# Patient Record
Sex: Male | Born: 1985 | Race: Black or African American | Hispanic: No | Marital: Single | Smoking: Never smoker
Health system: Southern US, Community
[De-identification: ages and names within clinical notes are randomized; demographics above are authoritative.]

## PROBLEM LIST (undated history)

## (undated) DIAGNOSIS — K922 Gastrointestinal hemorrhage, unspecified: Secondary | ICD-10-CM

## (undated) DIAGNOSIS — F1910 Other psychoactive substance abuse, uncomplicated: Secondary | ICD-10-CM

## (undated) DIAGNOSIS — K56609 Unspecified intestinal obstruction, unspecified as to partial versus complete obstruction: Secondary | ICD-10-CM

## (undated) DIAGNOSIS — F32A Depression, unspecified: Secondary | ICD-10-CM

## (undated) DIAGNOSIS — I639 Cerebral infarction, unspecified: Secondary | ICD-10-CM

## (undated) DIAGNOSIS — F1020 Alcohol dependence, uncomplicated: Secondary | ICD-10-CM

## (undated) HISTORY — DX: Depression, unspecified: F32.A

## (undated) HISTORY — DX: Alcohol dependence, uncomplicated: F10.20

## (undated) HISTORY — DX: Gastrointestinal hemorrhage, unspecified: K92.2

## (undated) HISTORY — DX: Cerebral infarction, unspecified: I63.9

## (undated) HISTORY — DX: Other psychoactive substance abuse, uncomplicated: F19.10

## (undated) HISTORY — DX: Unspecified intestinal obstruction, unspecified as to partial versus complete obstruction: K56.609

---

## 2009-02-25 ENCOUNTER — Emergency Department (HOSPITAL_COMMUNITY): Admission: EM | Admit: 2009-02-25 | Discharge: 2009-02-25 | Payer: Self-pay | Admitting: Emergency Medicine

## 2010-02-25 ENCOUNTER — Ambulatory Visit: Payer: Self-pay | Admitting: Internal Medicine

## 2010-02-25 DIAGNOSIS — L03818 Cellulitis of other sites: Secondary | ICD-10-CM

## 2010-02-25 DIAGNOSIS — L02818 Cutaneous abscess of other sites: Secondary | ICD-10-CM

## 2010-02-25 DIAGNOSIS — R1900 Intra-abdominal and pelvic swelling, mass and lump, unspecified site: Secondary | ICD-10-CM

## 2010-03-24 ENCOUNTER — Encounter: Admission: RE | Admit: 2010-03-24 | Discharge: 2010-03-24 | Payer: Self-pay | Admitting: Internal Medicine

## 2010-03-29 ENCOUNTER — Telehealth: Payer: Self-pay | Admitting: Internal Medicine

## 2010-04-02 ENCOUNTER — Telehealth: Payer: Self-pay | Admitting: Internal Medicine

## 2010-04-02 ENCOUNTER — Ambulatory Visit: Payer: Self-pay | Admitting: Internal Medicine

## 2010-04-02 DIAGNOSIS — N508 Other specified disorders of male genital organs: Secondary | ICD-10-CM

## 2010-04-05 LAB — CONVERTED CEMR LAB
Bilirubin Urine: NEGATIVE
Leukocytes, UA: NEGATIVE
Urine Glucose: NEGATIVE mg/dL
Urobilinogen, UA: 1 (ref 0.0–1.0)
pH: 6 (ref 5.0–8.0)

## 2010-05-05 ENCOUNTER — Ambulatory Visit: Payer: Self-pay | Admitting: Internal Medicine

## 2010-12-14 NOTE — Assessment & Plan Note (Signed)
Summary: NEW/ [employer]/ [place]   Vital Signs:  Patient profile:   25 year old male Height:      69 inches Weight:      155.56 pounds BMI:     23.06 O2 Sat:      98 % on Room air Temp:     97.5 degrees F oral Pulse rate:   70 / minute Pulse rhythm:   regular Resp:     16 per minute BP sitting:   112 / 72  (left arm) Cuff size:   large  Vitals Entered By: Rock Nephew CMA (February 25, 2010 2:55 PM)  O2 Flow:  Room air CC: Swollen R testicle w/ fever and sluggish Is Patient Diabetic? No   Primary Care Provider:  Etta Grandchild MD  CC:  Swollen R testicle w/ fever and sluggish.  History of Present Illness: New to me he complains of a bump in his right groin that has been there since 2007 but has slowly grown over the last few days.  Preventive Screening-Counseling & Management  Alcohol-Tobacco     Alcohol drinks/day: <1     Alcohol type: all     >5/day in last 3 mos: no     Alcohol Counseling: not indicated; use of alcohol is not excessive or problematic     Feels need to cut down: no     Feels annoyed by complaints: no     Feels guilty re: drinking: no     Needs 'eye opener' in am: no     Smoking Status: never  Caffeine-Diet-Exercise     Does Patient Exercise: yes  Hep-HIV-STD-Contraception     Hepatitis Risk: no risk noted     HIV Risk: no risk noted     STD Risk: no risk noted     TSE monthly: yes     Testicular SE Education/Counseling to perform regular STE  Safety-Violence-Falls     Seat Belt Use: yes     Helmet Use: yes     Firearms in the Home: firearms in the home     Smoke Detectors: yes     Violence in the Home: no risk noted     Sexual Abuse: no      Sexual History:  currently monogamous.        Drug Use:  never and no.        Blood Transfusions:  no.    Medications Prior to Update: 1)  None  Current Medications (verified): 1)  Multivitamin 2)  Sulfamethoxazole-Tmp Ds 800-160 Mg Tab (Trimethoprim-Sulfamethoxazole) .... Take 1 Tablet  By Mouth Morning and Night For 14 Days  Allergies (verified): No Known Drug Allergies  Past History:  Past Medical History: Unremarkable  Past Surgical History: Denies surgical history  Family History: Family History of Arthritis Family History Diabetes 1st degree relative Family History Hypertension Family History of Prostate CA 1st degree relative <50  Social History: Single Never Smoked Alcohol use-yes Drug use-no Regular exercise-yes Smoking Status:  never Drug Use:  never, no Does Patient Exercise:  yes Hepatitis Risk:  no risk noted HIV Risk:  no risk noted STD Risk:  no risk noted Seat Belt Use:  yes Sexual History:  currently monogamous Blood Transfusions:  no  Review of Systems  The patient denies anorexia, fever, weight loss, chest pain, prolonged cough, abdominal pain, hematuria, genital sores, suspicious skin lesions, enlarged lymph nodes, angioedema, and testicular masses.   General:  Denies chills, fatigue, fever, loss of appetite, malaise,  and sweats. GU:  Denies discharge, dysuria, genital sores, hematuria, incontinence, nocturia, urinary frequency, and urinary hesitancy.  Physical Exam  General:  alert, well-developed, well-nourished, well-hydrated, appropriate dress, normal appearance, healthy-appearing, cooperative to examination, and good hygiene.   Head:  normocephalic, atraumatic, no abnormalities observed, and no abnormalities palpated.   Eyes:  vision grossly intact, pupils equal, pupils round, and pupils reactive to light.   Mouth:  Oral mucosa and oropharynx without lesions or exudates.  Teeth in good repair. Neck:  supple, full ROM, no masses, no thyromegaly, no JVD, normal carotid upstroke, and no carotid bruits.   Lungs:  normal respiratory effort, no intercostal retractions, no accessory muscle use, normal breath sounds, no dullness, no fremitus, no crackles, and no wheezes.   Heart:  normal rate, regular rhythm, no murmur, no gallop, no  rub, and no JVD.   Abdomen:  soft, non-tender, normal bowel sounds, no distention, no masses, no guarding, no rigidity, no rebound tenderness, no abdominal hernia, no inguinal hernia, no hepatomegaly, and no splenomegaly.   Genitalia:  circumcised, no hydrocele, no varicocele, no scrotal masses, no testicular masses or atrophy, no cutaneous lesions, and no urethral discharge.  in the right groin adjacent to the right scrotum in the intertriginous region there is an elongated subcutaneously mass that is soft and possibly fluctuant. it measures 2 cm x 0.5 cm.  the overlying skin is normal with no erythema or lesions. Skin:  turgor normal, color normal, no rashes, no suspicious lesions, no ecchymoses, no petechiae, no purpura, no ulcerations, no edema, and tattoo(s).   Cervical Nodes:  no anterior cervical adenopathy and no posterior cervical adenopathy.   Axillary Nodes:  no R axillary adenopathy and no L axillary adenopathy.   Inguinal Nodes:  no R inguinal adenopathy and no L inguinal adenopathy.   Psych:  Cognition and judgment appear intact. Alert and cooperative with normal attention span and concentration. No apparent delusions, illusions, hallucinations   Impression & Recommendations:  Problem # 1:  PELVIC MASS (ICD-789.30) Assessment New I can't tell if this vascular, cystic, or infectious. will start antibiotics and get u/s to clarify. Orders: Radiology Referral (Radiology)  Problem # 2:  CELLULITIS/ABSCESS, SITE NEC (ZOX-096.8) Assessment: New  His updated medication list for this problem includes:    Sulfamethoxazole-tmp Ds 800-160 Mg Tab (Trimethoprim-sulfamethoxazole) .Marland Kitchen... Take 1 tablet by mouth morning and night for 14 days  Elevate affected area. Warm moist compresses for 20 minutes every 2 hours while awake. Take antibiotics as directed and take acetaminophen as needed. To be seen in 48-72 hours if no improvement, sooner if worse.  Complete Medication List: 1)  Multivitamin    2)  Sulfamethoxazole-tmp Ds 800-160 Mg Tab (Trimethoprim-sulfamethoxazole) .... Take 1 tablet by mouth morning and night for 14 days  Patient Instructions: 1)  Please schedule a follow-up appointment in 2 weeks. 2)  Take your antibiotic as prescribed until ALL of it is gone, but stop if you develop a rash or swelling and contact our office as soon as possible. Prescriptions: SULFAMETHOXAZOLE-TMP DS 800-160 MG TAB (TRIMETHOPRIM-SULFAMETHOXAZOLE) Take 1 tablet by mouth morning and night for 14 days  #28 x 1   Entered and Authorized by:   Etta Grandchild MD   Signed by:   Etta Grandchild MD on 02/25/2010   Method used:   Print then Give to Patient   RxID:   [id number]

## 2010-12-14 NOTE — Progress Notes (Signed)
Summary: RESULTS/Cause?  Phone Note Call from Patient Call back at [phone]   Summary of Call: Pt had u/s and wanted results. He has completed antibiotic and says area is back to normal. Pt wants to know if MD thinks that this was an infection, what was the cause?  Initial call taken by: Lamar Sprinkles, CMA,  Mar 29, 2010 10:00 AM  Follow-up for Phone Call        u/s was normal, must have been an infection that resolved Follow-up by: Etta Grandchild MD,  Mar 29, 2010 12:26 PM  Additional Follow-up for Phone Call Additional follow up Details #1::        Pt is concerned about what the cause of the infection was?  Additional Follow-up by: Lamar Sprinkles, CMA,  Mar 29, 2010 12:41 PM    Additional Follow-up for Phone Call Additional follow up Details #2::    staph under the skin Follow-up by: Etta Grandchild MD,  Mar 29, 2010 12:48 PM  Additional Follow-up for Phone Call Additional follow up Details #3:: Details for Additional Follow-up Action Taken: Pt informed  Additional Follow-up by: Lamar Sprinkles, CMA,  Mar 29, 2010 4:42 PM

## 2010-12-14 NOTE — Progress Notes (Signed)
  Phone Note Other Incoming   Caller: Randy Joyce  Summary of Call: Randy Joyce was seen by Dr Yetta Barre back in April for a lump or bump in his groin. Dr Rudean Curt him an antibiotic to take that Randy Joyce states helped with this. Randy Joyce states he has another one the has came up near his penis. He wants the same medication. Dr Yetta Barre is not here What do you suggest? Initial call taken by: Ami Bullins CMA,  Apr 02, 2010 9:07 AM  Follow-up for Phone Call        cannot say as this would require exam; I am not in the office today;  please ask Randy Joyce to see Dr Felicity Coyer or other today - may need antibx (antibx o/w not normally prescribed by phone as per office policy) Follow-up by: Corwin Levins MD,  Apr 02, 2010 9:10 AM  Additional Follow-up for Phone Call Additional follow up Details #1::        Randy Joyce advised and transferred to sch appt with Dr. Macario Golds this afternoon Additional Follow-up by: Margaret Pyle, CMA,  Apr 02, 2010 9:20 AM

## 2010-12-14 NOTE — Assessment & Plan Note (Signed)
Summary: DR Nemiah Commander PT/NO CLINIC--PER DAH SCHED--LUMP GROIN AREA  STC   Vital Signs:  Patient profile:   25 year old male Height:      69 inches Weight:      153.25 pounds BMI:     22.71 O2 Sat:      97 % on Room air Temp:     98.2 degrees F oral Pulse rate:   74 / minute BP sitting:   110 / 74  (left arm) Cuff size:   regular  Vitals Entered By: Lucious Groves (Apr 02, 2010 3:33 PM)  O2 Flow:  Room air CC: C/O lump has returned in pelvic region./kb Is Patient Diabetic? No Pain Assessment Patient in pain? no        Primary Care Provider:  Etta Grandchild MD  CC:  C/O lump has returned in pelvic region./kb.  History of Present Illness: C/o an elongated tender mass in R groin tender, first noticed this mass in mid- April. He had Bactrim - got better and mass moved  more inside; now  is worse again w/dull pain. 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   Current Medications (verified): 1)  Multivitamin  Allergies (verified): No Known Drug Allergies  Past History:  Past Medical History: Last updated: 02/25/2010 Unremarkable  Past Surgical History: Last updated: 02/25/2010 Denies surgical history  Family History: Last updated: 02/25/2010 Family History of Arthritis Family History Diabetes 1st degree relative Family History Hypertension Family History of Prostate CA 1st degree relative <50  Social History: Last updated: 02/25/2010 Single Never Smoked Alcohol use-yes Drug use-no Regular exercise-yes  Review of Systems  The patient denies fever, abdominal pain, severe indigestion/heartburn, hematuria, and genital sores.    Physical Exam  Abdomen:  soft, non-tender, normal bowel sounds, no distention, no masses, no guarding, no rigidity, no rebound tenderness, no abdominal hernia, no inguinal hernia, no hepatomegaly, and no splenomegaly.   Genitalia:  circumcised, no hydrocele, no varicocele, no scrotal masses, no testicular masses or atrophy, no  cutaneous lesions, and no urethral discharge.  In  the right prox. scrotum  there is an elongated subcutaneously mass that is firmt. it measures 2 cm x 0.5 cm.  the overlying skin is normal with no erythema or lesions.   Impression & Recommendations:  Problem # 1:  SCROTAL MASS (ICD-608.89) ? thrombosed scrotal vein on the R vs other Assessment New  Support underwear ASA Cipro if worse The office visit took longer than 25 min with patient councelling for more than 50% of the 25 min  Orders: TLB-Udip ONLY ([id number]) Urology Referral (Urology)  Complete Medication List: 1)  Vitamin D 1000 Unit Tabs (Cholecalciferol) .Marland Kitchen.. 1 by mouth qd 2)  Aspirin 325 Mg Tabs (Aspirin) .... Take 1 po bid pc x 1 wk, then 1 by mouth once daily pc 3)  Ciprofloxacin Hcl 500 Mg Tabs (Ciprofloxacin hcl) .Marland Kitchen.. 1 by mouth bid  Patient Instructions: 1)  Ellastic support anderwear ([employer]) 2)  Call if you are not better in a reasonable amount of time or if worse.  3)  Please schedule a follow-up appointment in 1 month Dr Yetta Barre. Prescriptions: CIPROFLOXACIN HCL 500 MG TABS (CIPROFLOXACIN HCL) 1 by mouth bid  #20 x 1   Entered and Authorized by:   Tresa Garter MD   Signed by:   Tresa Garter MD on 04/02/2010   Method used:   Print then Give to Patient   RxID:  [id number] ASPIRIN 325 MG TABS (ASPIRIN) Take 1 po bid pc x 1 wk, then 1 by mouth once daily pc  #100 x 1   Entered and Authorized by:   Tresa Garter MD   Signed by:   Tresa Garter MD on 04/02/2010   Method used:   Print then Give to Patient   RxID:   [id number] VITAMIN D 1000 UNIT TABS (CHOLECALCIFEROL) 1 by mouth qd  #100 x 3   Entered and Authorized by:   Tresa Garter MD   Signed by:   Tresa Garter MD on 04/02/2010   Method used:   Print then Give to Patient   RxID:   [id number]   Appended Document: DR Nemiah Commander PT/NO CLINIC--PER DAH SCHED--LUMP GROIN AREA  STC Called pt spoke with  grandmother, son already left to go back home will contact his mother and have her to return our call back...04/05/10@9 :15am/LMB

## 2011-07-25 IMAGING — US US PELVIS LIMITED
1 series · 12 of 12 positions shown · non-contrast
Comparison: None.

CLINICAL DATA: "Mass" in the region of the right groin pain.

US PELVIS TRANSVAGINAL

[Series 1: us pelvis limited · 0.06mm/px · 12 of 12 slices shown]
[im 1/12]
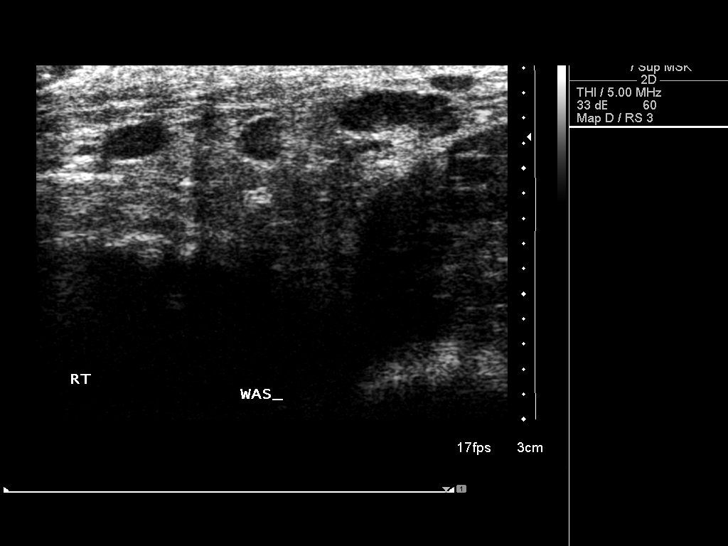
[im 2/12]
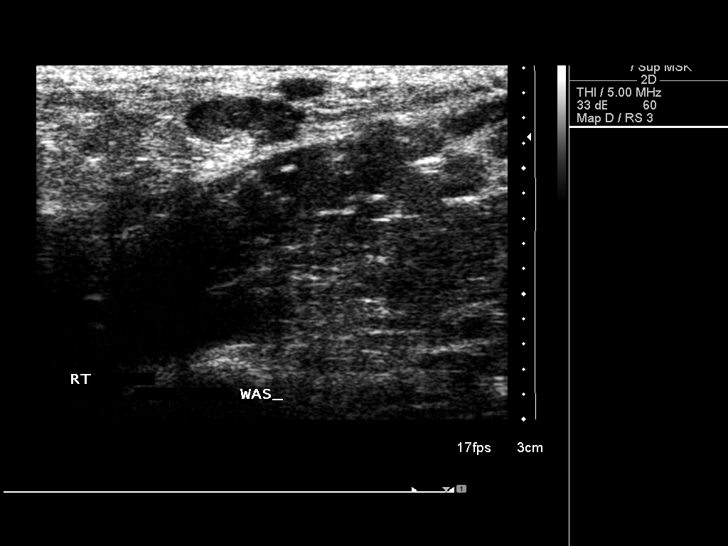
[im 3/12]
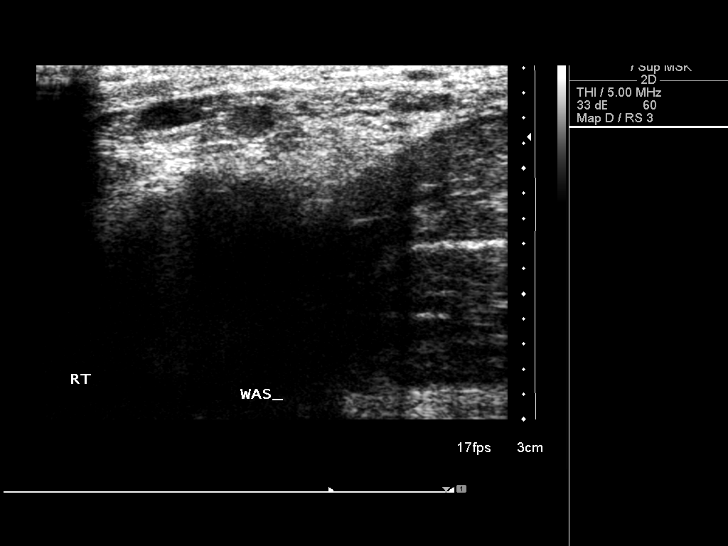
[im 4/12]
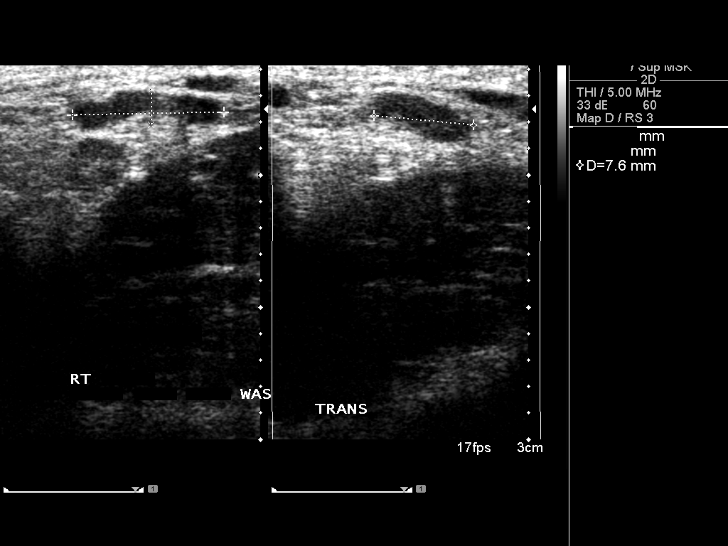
[im 5/12]
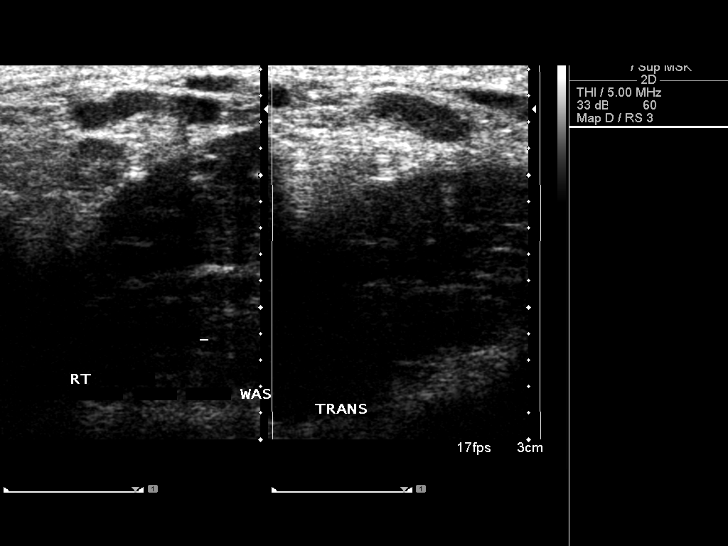
[im 6/12]
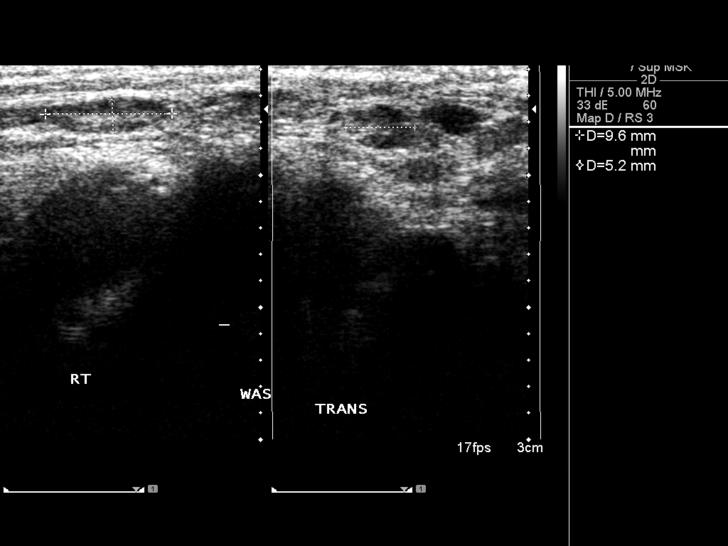
[im 7/12]
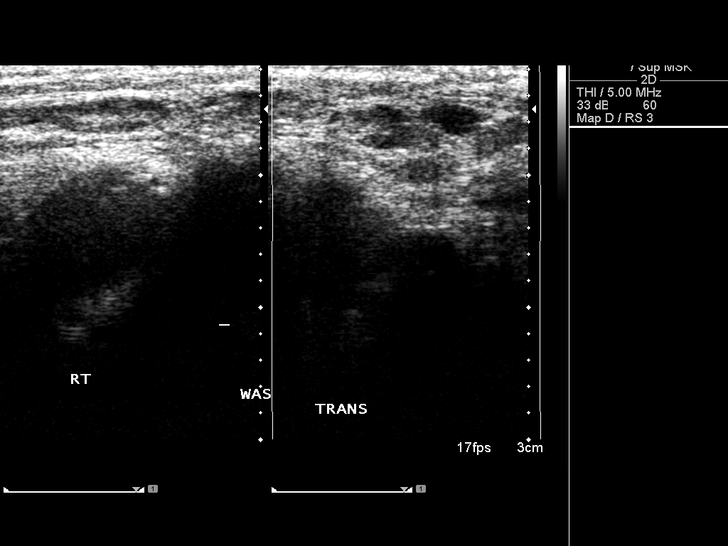
[im 8/12]
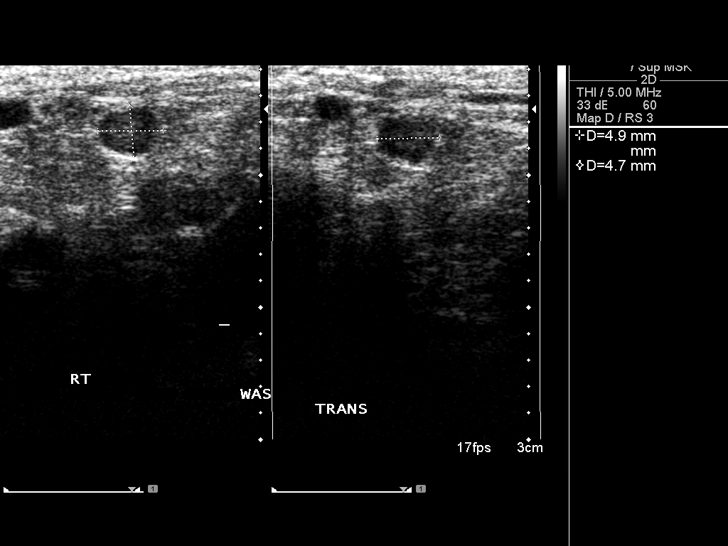
[im 9/12]
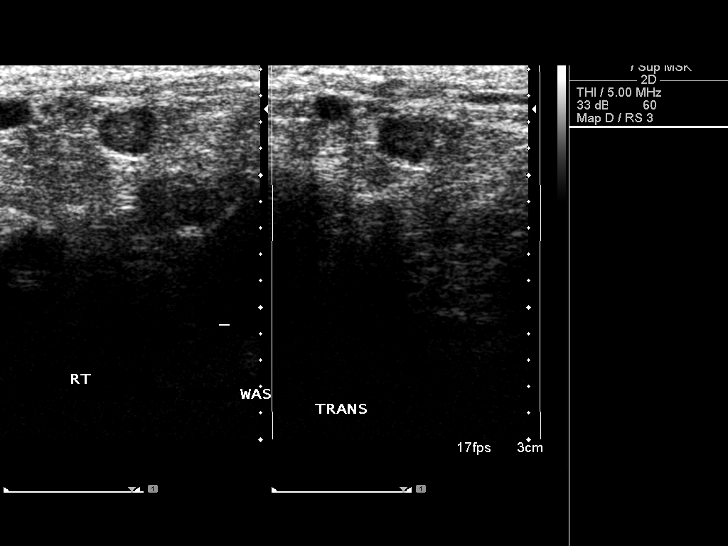
[im 10/12]
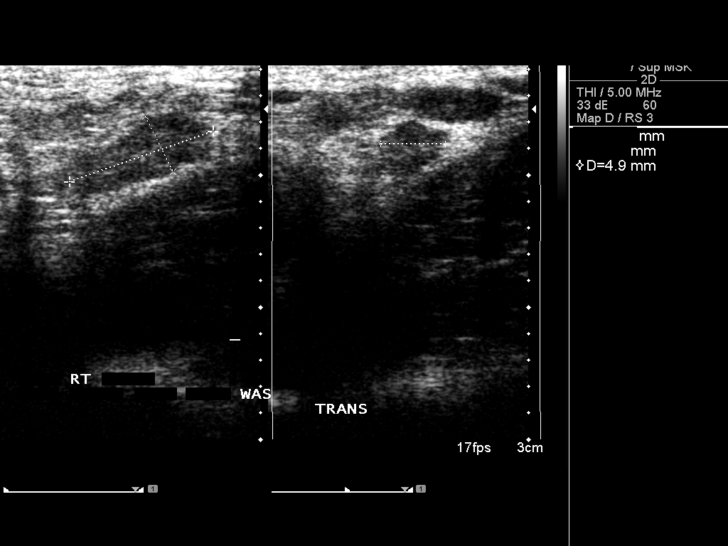
[im 11/12]
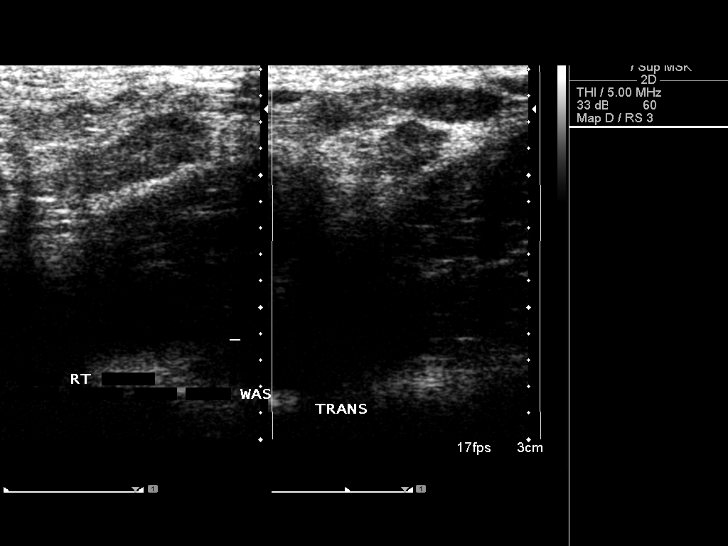
[im 12/12]
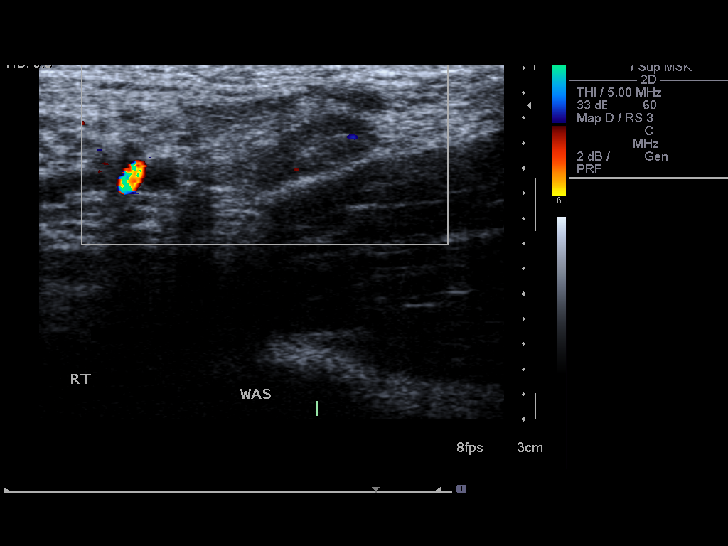

[12 of 12 positions shown; findings below may reference images not displayed]

FINDINGS: Focused ultrasound exam was performed in an area
identified by the patient as the region of concern.  This is in the
right groin area.  Several lymph nodes are identified in this
region which are in the upper normal size range.  There is no
evidence for focal superficial fluid collection to suggest a
subcutaneous abscess.

Ultrasound evaluation of the intraperitoneal structures and pelvic
floor was not performed as part of this study.
IMPRESSION: No evidence for abscess or mass in the area of the patient's
concern, identified as the superficial right groin region.

## 2011-12-10 ENCOUNTER — Encounter (HOSPITAL_COMMUNITY): Payer: Self-pay | Admitting: *Deleted

## 2011-12-10 ENCOUNTER — Emergency Department (HOSPITAL_COMMUNITY)
Admission: EM | Admit: 2011-12-10 | Discharge: 2011-12-10 | Disposition: A | Discharge status: Home / Self Care | Payer: BC Managed Care – PPO | Attending: Emergency Medicine | Admitting: Emergency Medicine

## 2011-12-10 DIAGNOSIS — M791 Myalgia, unspecified site: Secondary | ICD-10-CM

## 2011-12-10 DIAGNOSIS — IMO0001 Reserved for inherently not codable concepts without codable children: Secondary | ICD-10-CM | POA: Insufficient documentation

## 2011-12-10 NOTE — ED Provider Notes (Signed)
History     CSN: [id number]  Arrival date & time 12/10/11  1457   First MD Initiated Contact with Patient 12/10/11 1546      Chief Complaint  Patient presents with  . Generalized Body Aches    (Consider location/radiation/quality/duration/timing/severity/associated sxs/prior treatment) The history is provided by the patient.  pt notes body aches for past few days. Generalized. Mild, dull, constant. No focal pain. States had 'flu' last week w congestion, scratchy throat, occ cough, subj fever. States those symptoms have resolved, but mild body aches persist. Is eating and drinking. No cp or sob. No abd pain. No nvd. No gu c/o. No headache. Pt requests note for work.   History reviewed. No pertinent past medical history.  History reviewed. No pertinent past surgical history.  History reviewed. No pertinent family history.  History  Substance Use Topics  . Smoking status: Never Smoker   . Smokeless tobacco: Not on file  . Alcohol Use: No      Review of Systems  Constitutional: Negative for fever and chills.  HENT: Negative for sore throat, neck pain and neck stiffness.   Eyes: Negative for redness.  Respiratory: Negative for shortness of breath.   Cardiovascular: Negative for chest pain.  Gastrointestinal: Negative for abdominal pain.  Genitourinary: Negative for flank pain.  Musculoskeletal: Negative for back pain.  Skin: Negative for rash.  Neurological: Negative for headaches.  Hematological: Does not bruise/bleed easily.  Psychiatric/Behavioral: Negative for confusion.    Allergies  Review of patient's allergies indicates no known allergies.  Home Medications  No current outpatient prescriptions on file.  BP 118/68  Pulse 90  Temp(Src) 98.1 F (36.7 C) (Oral)  Resp 16  SpO2 97%  Physical Exam  Nursing note and vitals reviewed. Constitutional: He is oriented to person, place, and time. He appears well-developed and well-nourished. No distress.  HENT:    Head: Atraumatic.  Mouth/Throat: Oropharynx is clear and moist.  Eyes: Pupils are equal, round, and reactive to light.  Neck: Normal range of motion. Neck supple. No tracheal deviation present. No thyromegaly present.       No stiffness/rigidity  Cardiovascular: Normal rate, regular rhythm, normal heart sounds and intact distal pulses.  Exam reveals no gallop and no friction rub.   No murmur heard. Pulmonary/Chest: Effort normal and breath sounds normal. No accessory muscle usage. No respiratory distress.  Abdominal: Soft. He exhibits no distension. There is no tenderness.  Musculoskeletal: Normal range of motion. He exhibits no edema and no tenderness.  Lymphadenopathy:    He has no cervical adenopathy.  Neurological: He is alert and oriented to person, place, and time.       Motor intact. Steady gait.   Skin: Skin is warm and dry.  Psychiatric: He has a normal mood and affect.    ED Course  Procedures (including critical care time)     MDM  Pt requests work note.         Suzi Roots, MD 12/10/11 1600

## 2011-12-10 NOTE — ED Notes (Signed)
Patient here with c/o generalized body aches.  States that he had the flu last week with vomiting but the vomiting is gone and now he is just complaining for generalized body aches.  Denies temperature or constant cough

## 2011-12-10 NOTE — ED Notes (Signed)
MD at bedside. 

## 2013-11-14 DIAGNOSIS — I639 Cerebral infarction, unspecified: Secondary | ICD-10-CM

## 2013-11-14 HISTORY — DX: Cerebral infarction, unspecified: I63.9

## 2017-11-14 HISTORY — PX: MOUTH SURGERY: SHX715

## 2018-05-03 ENCOUNTER — Encounter (HOSPITAL_COMMUNITY): Payer: Self-pay | Admitting: Family Medicine

## 2018-05-03 ENCOUNTER — Ambulatory Visit (HOSPITAL_COMMUNITY)
Admission: EM | Admit: 2018-05-03 | Discharge: 2018-05-03 | Disposition: A | Discharge status: Home / Self Care | Payer: Self-pay | Attending: Nurse Practitioner | Admitting: Nurse Practitioner

## 2018-05-03 DIAGNOSIS — L0390 Cellulitis, unspecified: Secondary | ICD-10-CM

## 2018-05-03 DIAGNOSIS — S70362A Insect bite (nonvenomous), left thigh, initial encounter: Secondary | ICD-10-CM

## 2018-05-03 DIAGNOSIS — W57XXXA Bitten or stung by nonvenomous insect and other nonvenomous arthropods, initial encounter: Secondary | ICD-10-CM

## 2018-05-03 MED ORDER — SULFAMETHOXAZOLE-TRIMETHOPRIM 800-160 MG PO TABS: 1.0000 | ORAL_TABLET | Freq: Two times a day (BID) | ORAL | 0 refills | Status: AC

## 2018-05-03 NOTE — ED Triage Notes (Signed)
Pt here for 3 possible spider bites to left leg. One has been there a week and the other 2 formed a few days ago. sts some drainage and fever. Not feeling well.

## 2018-05-03 NOTE — ED Provider Notes (Addendum)
[place]    CSN: [id number] Arrival date & time: 05/03/18  3419     History   Chief Complaint Chief Complaint  Patient presents with  . Insect Bite    HPI Randy Joyce is a 32 y.o. male.   Subjective:   Randy Joyce is a 32 y.o. male who presents for evaluation of a possible skin infection located on the left thigh. Onset of symptoms was 1 week ago after being bitten by several spiders. He reports a gradual worsening course since that time. Symptoms include moderate pain. Patient denies any chills, fever, nausea or vomiting. No treatment to date  The following portions of the patient's history were reviewed and updated as appropriate: allergies, current medications, past family history, past medical history, past social history, past surgical history and problem list.        History reviewed. No pertinent past medical history.  Patient Active Problem List   Diagnosis Date Noted  . SCROTAL MASS 04/02/2010  . CELLULITIS/ABSCESS, SITE NEC 02/25/2010  . PELVIC MASS 02/25/2010    History reviewed. No pertinent surgical history.     Home Medications    Prior to Admission medications   Medication Sig Start Date End Date Taking? Authorizing Provider  sulfamethoxazole-trimethoprim (BACTRIM DS,SEPTRA DS) 800-160 MG tablet Take 1 tablet by mouth 2 (two) times daily for 10 days. 05/03/18 05/13/18  Enrique Sack, FNP    Family History History reviewed. No pertinent family history.  Social History Social History   Tobacco Use  . Smoking status: Never Smoker  Substance Use Topics  . Alcohol use: No  . Drug use: Not on file     Allergies   Patient has no known allergies.   Review of Systems Review of Systems  Constitutional: Negative for chills and fever.  Gastrointestinal: Negative for nausea and vomiting.  Skin: Positive for rash and wound.  All other systems reviewed and are negative.    Physical Exam Triage Vital Signs ED Triage Vitals    Enc Vitals Group     BP 05/03/18 1011 132/86     Pulse Rate 05/03/18 1011 76     Resp 05/03/18 1011 18     Temp 05/03/18 1011 98.1 F (36.7 C)     Temp src --      SpO2 05/03/18 1011 100 %     Weight --      Height --      Head Circumference --      Peak Flow --      Pain Score 05/03/18 1009 6     Pain Loc --      Pain Edu? --      Excl. in [place]? --    No data found.  Updated Vital Signs BP 132/86   Pulse 76   Temp 98.1 F (36.7 C)   Resp 18   SpO2 100%   Visual Acuity Right Eye Distance:   Left Eye Distance:   Bilateral Distance:    Right Eye Near:   Left Eye Near:    Bilateral Near:     Physical Exam  Constitutional: He is oriented to person, place, and time. He appears well-developed and well-nourished.  Neck: Normal range of motion. Neck supple.  Cardiovascular: Normal rate and regular rhythm.  Pulmonary/Chest: Effort normal and breath sounds normal.  Musculoskeletal: Normal range of motion.  Neurological: He is alert and oriented to person, place, and time.  Skin: Skin is warm and dry.  1.Pinpoint lesion noted to left upper thigh. No open area. No surrounding erythema. No drainage.  2. Pinpoint lesion noted to inner left upper thigh. No open area. Approximately 4x5 cm of surrounding erythema. No drainage.  3. Pinpoint lesion noted to left anterior lower leg. No open area. Approximately 5x3 cm area of surrounding erythema. No drainage.  Psychiatric: He has a normal mood and affect. His behavior is normal.     UC Treatments / Results  Labs (all labs ordered are listed, but only abnormal results are displayed) Labs Reviewed - No data to display  EKG None  Radiology No results found.  Procedures Procedures (including critical care time)  Medications Ordered in UC Medications - No data to display  Initial Impression / Assessment and Plan / UC Course  I have reviewed the triage vital signs and the nursing notes.  Pertinent labs & imaging  results that were available during my care of the patient were reviewed by me and considered in my medical decision making (see chart for details).    32 year old male with no significant medical history presenting with spider bites to the left thigh with possible skin infection. Patient AAOx3. Afebrile. VSS. Nontoxic appearing.    Plan:  1. Bactrim DS 1 tablet BID x 10 days  2. Wound care discussed  3. Warm compresses to areas several times daily 4.  Do not manipulate the wounds 5.  Tylenol or Motrin as needed for pain 6.  Discussed indications for immediate follow-up  Today's evaluation has revealed no signs of a dangerous process. Discussed diagnosis with patient. Patient aware of their diagnosis, possible red flag symptoms to watch out for and need for close follow up. Patient understands verbal and written discharge instructions. Patient comfortable with plan and disposition.  Patient has a clear mental status at this time, good insight into illness (after discussion and teaching) and has clear judgment to make decisions regarding their care.  Documentation was completed with the aid of voice recognition software. Transcription may contain typographical errors.   Final Clinical Impressions(s) / UC Diagnoses   Final diagnoses:  Insect bite of left thigh, initial encounter  Cellulitis, unspecified cellulitis site   Discharge Instructions   None    ED Prescriptions    Medication Sig Dispense Auth. Provider   sulfamethoxazole-trimethoprim (BACTRIM DS,SEPTRA DS) 800-160 MG tablet Take 1 tablet by mouth 2 (two) times daily for 10 days. 20 tablet Enrique Sack, FNP     Controlled Substance Prescriptions  Controlled Substance Registry consulted? Not Applicable   Enrique Sack, [place] 05/03/18 [place], [place], [place] 05/03/18 1036

## 2019-05-21 ENCOUNTER — Inpatient Hospital Stay
Admit: 2019-05-21 | Discharge: 2019-05-21 | Disposition: A | Discharge status: Home / Self Care | Payer: Self-pay | Attending: Emergency Medicine

## 2019-05-21 DIAGNOSIS — L03011 Cellulitis of right finger: Secondary | ICD-10-CM

## 2019-05-21 MED ORDER — CEPHALEXIN 500 MG CAP
500 mg | ORAL_CAPSULE | Freq: Four times a day (QID) | ORAL | 0 refills | Status: DC
Start: 2019-05-21 — End: 2019-05-23

## 2019-05-21 MED ORDER — IBUPROFEN 800 MG TAB
800 mg | ORAL_TABLET | Freq: Four times a day (QID) | ORAL | 0 refills | Status: AC | PRN
Start: 2019-05-21 — End: 2019-05-28

## 2019-05-21 NOTE — ED Triage Notes (Signed)
Ambulatory to triage wearing mask from home.  Pt states he was bit by spider at work last night.  Swelling to right hand noted.

## 2019-05-21 NOTE — ED Notes (Signed)
I have reviewed discharge instructions with the patient.  The patient verbalized understanding.    Patient left ED via Discharge Method: ambulatory to Home with self.    Opportunity for questions and clarification provided.       Patient given 2 scripts.         To continue your aftercare when you leave the hospital, you may receive an automated call from our care team to check in on how you are doing.  This is a free service and part of our promise to provide the best care and service to meet your aftercare needs.??? If you have questions, or wish to unsubscribe from this service please call [phone].  Thank you for Choosing our [facility].

## 2019-05-21 NOTE — ED Provider Notes (Addendum)
HPI:  58 male here with right finger pain.  Stated he was bitten by a spider" at work.  Last night.  Since then has had swelling.  Denies any other trauma.  Denies any fever.  Pain with movement at the right fifth digit.    ROS  Constitutional: No fever, no chills  Skin: + rash  Respiratory: No shortness of breath  Cardiovascular: No chest pain  Gastrointestinal:    GU:   MSK:  no joint pain  Neuro: No headache, no change in mental status, no numbness, no tingling, no weakness  Psych:   Endocrine:   All other review of systems positive per history of present illness and the above otherwise negative or noncontributory.    Visit Vitals  BP 128/83 (BP 1 Location: Right arm, BP Patient Position: Sitting)   Pulse 81   Temp 97.8 ??F (36.6 ??C)   Resp 18   Ht 5\' 10"  (1.778 m)   Wt 79.8 kg (176 lb)   SpO2 95%   BMI 25.25 kg/m??     No past medical history on file.  No past surgical history on file.  None         Adult Exam   General: alert, no acute distress  Head: normocephalic, atraumatic  ENT: moist mucous membranes  Neck:  full range of motion  Back: non-tender, full range of motion  Musculoskeletal: Right fifth digit at the MCP dorsum aspect with what appeared to be consistent with a wound.  Mild localized erythema and cellulitis.  He is able to open and close his hand with some complaint of discomfort.  There is no skin crepitus.  There is no swelling noted in the palmar surface.  Neurological: alert and oriented x 4, no gross focal deficits; normal speech  Psychiatric: cooperative; appropriate mood and affect    MDM:  Concern for cellulitis from wound.  Will place on Keflex for 7 days.  Motrin as needed for pain.  Return precaution given.  No further questions or concerns.  Do not suspect bacteremia, extensor tenosynovitis    Dragon voice recognition software was used to create this note. Although the note has been reviewed and corrected where necessary, additional  errors may have been overlooked and remain in the text.

## 2019-05-21 NOTE — ED Notes (Signed)
Ambulatory to triage wearing mask from home.  Pt states he was bit by spider at work last night.  Swelling to right hand noted.

## 2019-05-21 NOTE — ED Provider Notes (Signed)
HPI:  73 male here with right finger pain.  Stated he was bitten by a spider" at work.  Last night.  Since then has had swelling.  Denies any other trauma.  Denies any fever.  Pain with movement at the right fifth digit.    ROS  Constitutional: No fever, no chills  Skin: + rash  Respiratory: No shortness of breath  Cardiovascular: No chest pain  Gastrointestinal:    GU:   MSK:  no joint pain  Neuro: No headache, no change in mental status, no numbness, no tingling, no weakness  Psych:   Endocrine:   All other review of systems positive per history of present illness and the above otherwise negative or noncontributory.    Visit Vitals  BP 128/83 (BP 1 Location: Right arm, BP Patient Position: Sitting)   Pulse 81   Temp 97.8 ??F (36.6 ??C)   Resp 18   Ht 5\' 10"  (1.778 m)   Wt 79.8 kg (176 lb)   SpO2 95%   BMI 25.25 kg/m??     No past medical history on file.  No past surgical history on file.  None         Adult Exam   General: alert, no acute distress  Head: normocephalic, atraumatic  ENT: moist mucous membranes  Neck:  full range of motion  Back: non-tender, full range of motion  Musculoskeletal: Right fifth digit at the MCP dorsum aspect with what appeared to be consistent with a wound.  Mild localized erythema and cellulitis.  He is able to open and close his hand with some complaint of discomfort.  There is no skin crepitus.  There is no swelling noted in the palmar surface.  Neurological: alert and oriented x 4, no gross focal deficits; normal speech  Psychiatric: cooperative; appropriate mood and affect    MDM:  Concern for cellulitis from wound.  Will place on Keflex for 7 days.  Motrin as needed for pain.  Return precaution given.  No further questions or concerns.  Do not suspect bacteremia, extensor tenosynovitis    Dragon voice recognition software was used to create this note. Although the note has been reviewed and corrected where necessary, additional errors may have been overlooked and remain in the  text.

## 2019-05-21 NOTE — ED Notes (Signed)

## 2019-05-23 ENCOUNTER — Inpatient Hospital Stay
Admit: 2019-05-23 | Discharge: 2019-05-23 | Disposition: A | Discharge status: Home / Self Care | Payer: Self-pay | Attending: Emergency Medicine

## 2019-05-23 ENCOUNTER — Emergency Department: Admit: 2019-05-23 | Payer: Self-pay

## 2019-05-23 DIAGNOSIS — L02511 Cutaneous abscess of right hand: Secondary | ICD-10-CM

## 2019-05-23 MED ORDER — TRIMETHOPRIM-SULFAMETHOXAZOLE 160 MG-800 MG TAB
160-800 mg | ORAL_TABLET | Freq: Two times a day (BID) | ORAL | 0 refills | Status: AC
Start: 2019-05-23 — End: 2019-05-30

## 2019-05-23 MED ORDER — MORPHINE 4 MG/ML INTRAVENOUS SOLUTION
4 mg/mL | INTRAVENOUS | Status: AC
Start: 2019-05-23 — End: 2019-05-23
  Administered 2019-05-23: 20:00:00 via INTRAMUSCULAR

## 2019-05-23 MED ORDER — HYDROCODONE-ACETAMINOPHEN 5 MG-325 MG TAB
5-325 mg | ORAL_TABLET | Freq: Four times a day (QID) | ORAL | 0 refills | Status: AC | PRN
Start: 2019-05-23 — End: 2019-05-26

## 2019-05-23 MED FILL — MORPHINE 4 MG/ML INTRAVENOUS SOLUTION: 4 mg/mL | INTRAVENOUS | Qty: 1

## 2019-05-23 NOTE — ED Provider Notes (Signed)
HPI:  [id number] male here with persistent right knuckle swelling and pain.  Patient was actually seen by me earlier this week after he was "bitten by a spider" at work.  Swollen that was suspicious for cellulitis.  Has been taking antibiotic as prescribed with no improvement in symptoms.  Denies any new trauma.  Denies any fever.    ROS  Constitutional: No fever, no chills  Skin: no rash  Respiratory: No shortness of breath  Cardiovascular: No chest pain  Gastrointestinal: no abdominal pain  GU: No dysuria  MSK: No back pain, no muscle pain, + joint pain  Neuro: No headache, no change in mental status, no numbness, no tingling, no weakness  Psych:   Endocrine:   All other review of systems positive per history of present illness and the above otherwise negative or noncontributory.    Visit Vitals  BP 138/85   Pulse 83   Temp 98.5 ??F (36.9 ??C)   Resp 18   SpO2 97%     History reviewed. No pertinent past medical history.  History reviewed. No pertinent surgical history.  Prior to Admission Medications   Prescriptions Last Dose Informant Patient Reported? Taking?   cephALEXin (Keflex) 500 mg capsule   No No   Sig: Take 1 Cap by mouth four (4) times daily for 7 days. Indications: an infection of the skin and the tissue below the skin   ibuprofen (MOTRIN) 800 mg tablet   No No   Sig: Take 1 Tab by mouth every six (6) hours as needed for Pain for up to 7 days. Indications: pain      Facility-Administered Medications: None         Adult Exam   General: alert, no acute distress  Head: normocephalic, atraumatic  ENT: moist mucous membranes  Neck: supple, non-tender; full range of motion  Cardiovascular:   Respiratory:  normal respirations  Gastrointestinal:   Back: non-tender, full range of motion  Musculoskeletal: normal range of motion, normal strength.  Right hand dorsum over the fifth MCP with worsening swelling, redness.  There is now a fluctuant pocket that is suspicious for cellulitis with abscess.   Some mild resistant with closing of the fifth right digit.   Capillary refill intact with good distal pulses  Neurological: alert and oriented x 4, no gross focal deficits; normal speech  Psychiatric: cooperative; appropriate mood and affect    MDM:  We will obtain an x-ray.  Give a dose of IM morphine.  Will need to likely perform incision and drainage for suspicious abscess    5:03 PM  Xray without any foreign body.  Bedside incision and drainage performed.  Between 3 to 4 cc of purulent drainage noted from the right knuckle.  Will switch to Bactrim DS for 7 days.  Give Norco for pain.  Return precaution given.    Xr Hand Rt Min 3 V    Result Date: 05/23/2019  Right hand 3 views dated 05/23/2019 CLINICAL INFORMATION pain and swelling at fifth knuckle after spider bite There is an old healed fracture of the second metacarpal no acute fracture or dislocation. There is no radiopaque foreign body. Mild soft tissue swelling at the fifth finger.     IMPRESSION: No acute bony abnormality      I&D   Procedure Note - Incision and Drainage  Performed by: Charolette Childat Maiko Salais, MD  Immediately prior to the procedure, the patient was reevaluated and found suitable for the planned procedure and any planned medications.  The identified area was prepped with chlorhexidine. Anesthesia was achieved with 2 mLs of 1% Lidocaine. The Rt hand abscess was incised with a #11 blade and 3-4 mLs of purulent drainage was expressed. A dressing was applied.  The procedure took 10 minutes.  The patient tolerated the procedure well.      Dragon voice recognition software was used to create this note. Although the note has been reviewed and corrected where necessary, additional errors may have been overlooked and remain in the text.

## 2019-05-23 NOTE — ED Notes (Signed)
Patient advises that he does have family/ friend who will be coming to pick them up for transport.

## 2019-05-23 NOTE — ED Triage Notes (Signed)
Pt states he was seen for a spider bite on R hand 3 days ago and states the swelling and pain is getting worse. States he is taking the Keflex that was prescribed last time. Masked on arrival.

## 2019-05-23 NOTE — ED Notes (Signed)
I have reviewed discharge instructions with the patient.  The patient verbalized understanding.    Patient left ED via Discharge Method: ambulatory to Home with self.    Opportunity for questions and clarification provided.       Patient given 2 scripts.         To continue your aftercare when you leave the hospital, you may receive an automated call from our care team to check in on how you are doing.  This is a free service and part of our promise to provide the best care and service to meet your aftercare needs.??? If you have questions, or wish to unsubscribe from this service please call [phone].  Thank you for Choosing our [facility].

## 2019-05-23 NOTE — ED Notes (Signed)

## 2019-05-23 NOTE — ED Provider Notes (Signed)
HPI:  58 male here with persistent right knuckle swelling and pain.  Patient was actually seen by me earlier this week after he was "bitten by a spider" at work.  Swollen that was suspicious for cellulitis.  Has been taking antibiotic as prescribed with no improvement in symptoms.  Denies any new trauma.  Denies any fever.    ROS  Constitutional: No fever, no chills  Skin: no rash  Respiratory: No shortness of breath  Cardiovascular: No chest pain  Gastrointestinal: no abdominal pain  GU: No dysuria  MSK: No back pain, no muscle pain, + joint pain  Neuro: No headache, no change in mental status, no numbness, no tingling, no weakness  Psych:   Endocrine:   All other review of systems positive per history of present illness and the above otherwise negative or noncontributory.    Visit Vitals  BP 138/85   Pulse 83   Temp 98.5 ??F (36.9 ??C)   Resp 18   SpO2 97%     History reviewed. No pertinent past medical history.  History reviewed. No pertinent surgical history.  Prior to Admission Medications   Prescriptions Last Dose Informant Patient Reported? Taking?   cephALEXin (Keflex) 500 mg capsule   No No   Sig: Take 1 Cap by mouth four (4) times daily for 7 days. Indications: an infection of the skin and the tissue below the skin   ibuprofen (MOTRIN) 800 mg tablet   No No   Sig: Take 1 Tab by mouth every six (6) hours as needed for Pain for up to 7 days. Indications: pain      Facility-Administered Medications: None         Adult Exam   General: alert, no acute distress  Head: normocephalic, atraumatic  ENT: moist mucous membranes  Neck: supple, non-tender; full range of motion  Cardiovascular:   Respiratory:  normal respirations  Gastrointestinal:   Back: non-tender, full range of motion  Musculoskeletal: normal range of motion, normal strength.  Right hand dorsum over the fifth MCP with worsening swelling, redness.  There is now a fluctuant pocket that is suspicious for cellulitis with abscess.  Some mild resistant with  closing of the fifth right digit.   Capillary refill intact with good distal pulses  Neurological: alert and oriented x 4, no gross focal deficits; normal speech  Psychiatric: cooperative; appropriate mood and affect    MDM:  We will obtain an x-ray.  Give a dose of IM morphine.  Will need to likely perform incision and drainage for suspicious abscess    5:03 PM  Xray without any foreign body.  Bedside incision and drainage performed.  Between 3 to 4 cc of purulent drainage noted from the right knuckle.  Will switch to Bactrim DS for 7 days.  Give Norco for pain.  Return precaution given.    Xr Hand Rt Min 3 V    Result Date: 05/23/2019  Right hand 3 views dated 05/23/2019 CLINICAL INFORMATION pain and swelling at fifth knuckle after spider bite There is an old healed fracture of the second metacarpal no acute fracture or dislocation. There is no radiopaque foreign body. Mild soft tissue swelling at the fifth finger.     IMPRESSION: No acute bony abnormality      I&D   Procedure Note - Incision and Drainage  Performed by: Jacqlyn Krauss, MD  Immediately prior to the procedure, the patient was reevaluated and found suitable for the planned procedure and any planned medications.  The identified area was prepped with chlorhexidine. Anesthesia was achieved with 2 mLs of 1% Lidocaine. The Rt hand abscess was incised with a #11 blade and 3-4 mLs of purulent drainage was expressed. A dressing was applied.  The procedure took 10 minutes.  The patient tolerated the procedure well.      Dragon voice recognition software was used to create this note. Although the note has been reviewed and corrected where necessary, additional errors may have been overlooked and remain in the text.

## 2019-05-23 NOTE — ED Notes (Signed)
Patient advises that he does have family/ friend who will be coming to pick them up for transport.

## 2019-05-23 NOTE — ED Notes (Signed)
Pt states he was seen for a spider bite on R hand 3 days ago and states the swelling and pain is getting worse. States he is taking the Keflex that was prescribed last time. Masked on arrival.

## 2019-05-29 ENCOUNTER — Inpatient Hospital Stay: Admit: 2019-05-29 | Discharge: 2019-05-29 | Payer: Self-pay

## 2019-05-29 DIAGNOSIS — Z5321 Procedure and treatment not carried out due to patient leaving prior to being seen by health care provider: Secondary | ICD-10-CM

## 2019-05-29 NOTE — ED Notes (Signed)
Unable to locate when attempting to room pt. Per registation staff pt left.

## 2019-05-29 NOTE — ED Triage Notes (Signed)
Pt arrives with complaints of needing his wound to his right hand looked at. States he was told to come back here for revaluation to see if the antibiotics are working. Appears to be healing.

## 2019-05-29 NOTE — ED Notes (Signed)
Pt arrives with complaints of needing his wound to his right hand looked at. States he was told to come back here for revaluation to see if the antibiotics are working. Appears to be healing.

## 2019-05-29 NOTE — ED Notes (Signed)
Unable to locate when attempting to room pt. Per registation staff pt left.

## 2022-04-29 ENCOUNTER — Other Ambulatory Visit: Payer: Self-pay

## 2022-04-29 ENCOUNTER — Encounter (HOSPITAL_COMMUNITY): Payer: Self-pay

## 2022-04-29 ENCOUNTER — Emergency Department (HOSPITAL_COMMUNITY)
Admission: EM | Admit: 2022-04-29 | Discharge: 2022-04-29 | Disposition: A | Discharge status: Home / Self Care | Payer: Self-pay | Attending: Emergency Medicine | Admitting: Emergency Medicine

## 2022-04-29 DIAGNOSIS — R7309 Other abnormal glucose: Secondary | ICD-10-CM | POA: Insufficient documentation

## 2022-04-29 DIAGNOSIS — F1210 Cannabis abuse, uncomplicated: Secondary | ICD-10-CM | POA: Insufficient documentation

## 2022-04-29 DIAGNOSIS — R55 Syncope and collapse: Secondary | ICD-10-CM | POA: Insufficient documentation

## 2022-04-29 DIAGNOSIS — F1410 Cocaine abuse, uncomplicated: Secondary | ICD-10-CM | POA: Insufficient documentation

## 2022-04-29 LAB — URINALYSIS, ROUTINE W REFLEX MICROSCOPIC
Bacteria, UA: NONE SEEN
Bilirubin Urine: NEGATIVE
Glucose, UA: NEGATIVE mg/dL
Hgb urine dipstick: NEGATIVE
Ketones, ur: NEGATIVE mg/dL
Leukocytes,Ua: NEGATIVE
Nitrite: NEGATIVE
Protein, ur: 30 mg/dL — AB
Specific Gravity, Urine: 1.011 (ref 1.005–1.030)
pH: 6 (ref 5.0–8.0)

## 2022-04-29 LAB — CBC
HCT: 37.2 % — ABNORMAL LOW (ref 39.0–52.0)
Hemoglobin: 13.2 g/dL (ref 13.0–17.0)
MCH: 33.5 pg (ref 26.0–34.0)
MCHC: 35.5 g/dL (ref 30.0–36.0)
MCV: 94.4 fL (ref 80.0–100.0)
Platelets: 245 10*3/uL (ref 150–400)
RBC: 3.94 MIL/uL — ABNORMAL LOW (ref 4.22–5.81)
RDW: 12.9 % (ref 11.5–15.5)
WBC: 4.8 10*3/uL (ref 4.0–10.5)
nRBC: 0 % (ref 0.0–0.2)

## 2022-04-29 LAB — BASIC METABOLIC PANEL
Anion gap: 10 (ref 5–15)
BUN: 13 mg/dL (ref 6–20)
CO2: 22 mmol/L (ref 22–32)
Calcium: 8.5 mg/dL — ABNORMAL LOW (ref 8.9–10.3)
Chloride: 107 mmol/L (ref 98–111)
Creatinine, Ser: 1.07 mg/dL (ref 0.61–1.24)
GFR, Estimated: >60 mL/min (ref >60)
Glucose, Bld: 113 mg/dL — ABNORMAL HIGH (ref 70–99)
Potassium: 3.2 mmol/L — ABNORMAL LOW (ref 3.5–5.1)
Sodium: 139 mmol/L (ref 135–145)

## 2022-04-29 LAB — CBG MONITORING, ED: Glucose-Capillary: 133 mg/dL — ABNORMAL HIGH (ref 70–99)

## 2022-04-29 LAB — RAPID URINE DRUG SCREEN, HOSP PERFORMED
Amphetamines: NOT DETECTED
Barbiturates: NOT DETECTED
Benzodiazepines: NOT DETECTED
Cocaine: POSITIVE — AB
Opiates: NOT DETECTED
Tetrahydrocannabinol: POSITIVE — AB

## 2022-04-29 MED ORDER — POTASSIUM CHLORIDE CRYS ER 20 MEQ PO TBCR
40.0000 meq | EXTENDED_RELEASE_TABLET | Freq: Once | ORAL | Status: AC
  Administered 2022-04-29: 40 meq via ORAL
  Filled 2022-04-29: qty 2

## 2022-04-29 MED ORDER — SODIUM CHLORIDE 0.9 % IV BOLUS (SEPSIS)
1000.0000 mL | Freq: Once | INTRAVENOUS | Status: AC
  Administered 2022-04-29: 1000 mL via INTRAVENOUS

## 2022-04-29 NOTE — ED Notes (Signed)
Ambulated to bathroom steady gait

## 2022-04-29 NOTE — ED Provider Notes (Signed)
[facility] Provider Note   CSN: [id number] Arrival date & time: 04/29/22  0121     History  Chief Complaint  Patient presents with   Loss of Consciousness    Randy Joyce is a 36 y.o. male.  The history is provided by the patient and a parent.  Loss of Consciousness Episode history:  Multiple Progression:  Resolved Chronicity:  New Context: standing up   Associated symptoms: no chest pain, no difficulty breathing, no shortness of breath and no vomiting       Patient without any new medical conditions presents after 3 syncopal episodes.  Patient reports he was trying to stand up at home when he passed out.  Father who witnessed the episode reports there was no seizure activity and he regained consciousness soon after.  No traumatic injuries, no head injury  EMS reports patient admitted to alcohol use, marijuana use as well No previous history of syncope No tongue biting, no urinary incontinence Home Medications Prior to Admission medications   Not on File      Allergies    Patient has no known allergies.    Review of Systems   Review of Systems  Respiratory:  Negative for shortness of breath.   Cardiovascular:  Positive for syncope. Negative for chest pain.  Gastrointestinal:  Negative for vomiting.  Neurological:  Positive for syncope.    Physical Exam Updated Vital Signs BP 115/82   Pulse 73   Temp 98.1 F (36.7 C) (Oral)   Resp 14   SpO2 100%  Physical Exam CONSTITUTIONAL: Well developed/well nourished HEAD: Normocephalic/atraumatic, no signs of trauma EYES: EOMI/PERRL ENMT: Mucous membranes moist NECK: supple no meningeal signs SPINE/BACK:entire spine nontender CV: S1/S2 noted, no murmurs/rubs/gallops noted LUNGS: Lungs are clear to auscultation bilaterally, no apparent distress ABDOMEN: soft, nontender, no rebound or guarding, bowel sounds noted throughout abdomen GU:no cva tenderness NEURO: Pt is  awake/alert/appropriate, moves all extremitiesx4.  No facial droop.   EXTREMITIES: pulses normal/equal, full ROM, no deformities, no calf tenderness or edema SKIN: warm, color normal PSYCH: no abnormalities of mood noted, alert and oriented to situation  ED Results / Procedures / Treatments   Labs (all labs ordered are listed, but only abnormal results are displayed) Labs Reviewed  BASIC METABOLIC PANEL - Abnormal; Notable for the following components:      Result Value   Potassium 3.2 (*)    Glucose, Bld 113 (*)    Calcium 8.5 (*)    All other components within normal limits  CBC - Abnormal; Notable for the following components:   RBC 3.94 (*)    HCT 37.2 (*)    All other components within normal limits  URINALYSIS, ROUTINE W REFLEX MICROSCOPIC - Abnormal; Notable for the following components:   Color, Urine STRAW (*)    Protein, ur 30 (*)    All other components within normal limits  RAPID URINE DRUG SCREEN, HOSP PERFORMED - Abnormal; Notable for the following components:   Cocaine POSITIVE (*)    Tetrahydrocannabinol POSITIVE (*)    All other components within normal limits  CBG MONITORING, ED - Abnormal; Notable for the following components:   Glucose-Capillary 133 (*)    All other components within normal limits    EKG EKG Interpretation  Date/Time:  Friday April 29 2022 01:21:05 EDT Ventricular Rate:  81 PR Interval:  166 QRS Duration: 86 QT Interval:  388 QTC Calculation: 450 R Axis:   44 Text Interpretation: Normal sinus  rhythm Cannot rule out Anterior infarct , age undetermined Abnormal ECG Confirmed by Ripley Fraise [phone]) on 04/29/2022 5:31:21 AM  Radiology No results found.  Procedures Procedures    Medications Ordered in ED Medications  sodium chloride 0.9 % bolus 1,000 mL (1,000 mLs Intravenous New Bag/Given 04/29/22 0606)  potassium chloride SA (KLOR-CON M) CR tablet 40 mEq (has no administration in time range)    ED Course/ Medical Decision  Making/ A&P                           Medical Decision Making Amount and/or Complexity of Data Reviewed Labs: ordered.  Risk Prescription drug management.   Patient presents after multiple syncopal episodes.  Given his lab findings, suspect this is likely due to his underlying substance use disorder.  Patient denies any other known medical conditions.  There are no high risk features.  EKG is overall unremarkable.  He does have mild hypokalemia.  Patient will be given oral potassium and IV fluids and anticipate discharge home        Final Clinical Impression(s) / ED Diagnoses Final diagnoses:  Syncope and collapse    Rx / DC Orders ED Discharge Orders     None         Ripley Fraise, MD 04/29/22 [phone]

## 2022-04-29 NOTE — ED Triage Notes (Signed)
Pt comes via [facility] for syncope, pt reports that he has 3 syncopal events prior to EMS arrival, pt reports ETOH and mariajuana use today, pt has pinpoint pupils, AxO x 4

## 2022-09-16 ENCOUNTER — Encounter: Payer: Self-pay | Admitting: Gastroenterology

## 2022-11-09 ENCOUNTER — Encounter: Payer: Self-pay | Admitting: Gastroenterology

## 2022-11-09 ENCOUNTER — Ambulatory Visit: Payer: Commercial Managed Care - HMO | Admitting: Gastroenterology

## 2022-11-09 ENCOUNTER — Other Ambulatory Visit (INDEPENDENT_AMBULATORY_CARE_PROVIDER_SITE_OTHER): Payer: Commercial Managed Care - HMO

## 2022-11-09 VITALS — BP 151/98 | HR 103 | Ht 69.0 in | Wt 199.0 lb

## 2022-11-09 DIAGNOSIS — R109 Unspecified abdominal pain: Secondary | ICD-10-CM

## 2022-11-09 DIAGNOSIS — R197 Diarrhea, unspecified: Secondary | ICD-10-CM

## 2022-11-09 LAB — CBC WITH DIFFERENTIAL/PLATELET
Basophils Absolute: 0.1 10*3/uL (ref 0.0–0.1)
Basophils Relative: 0.9 % (ref 0.0–3.0)
Eosinophils Absolute: 0.1 10*3/uL (ref 0.0–0.7)
Eosinophils Relative: 0.9 % (ref 0.0–5.0)
HCT: 43.2 % (ref 39.0–52.0)
Hemoglobin: 14.6 g/dL (ref 13.0–17.0)
Lymphocytes Relative: 30.8 % (ref 12.0–46.0)
Lymphs Abs: 2.3 10*3/uL (ref 0.7–4.0)
MCHC: 33.8 g/dL (ref 30.0–36.0)
MCV: 92.8 fl (ref 78.0–100.0)
Monocytes Absolute: 0.6 10*3/uL (ref 0.1–1.0)
Monocytes Relative: 8.2 % (ref 3.0–12.0)
Neutro Abs: 4.4 10*3/uL (ref 1.4–7.7)
Neutrophils Relative %: 59.2 % (ref 43.0–77.0)
Platelets: 265 10*3/uL (ref 150.0–400.0)
RBC: 4.65 Mil/uL (ref 4.22–5.81)
RDW: 13.5 % (ref 11.5–15.5)
WBC: 7.4 10*3/uL (ref 4.0–10.5)

## 2022-11-09 LAB — COMPREHENSIVE METABOLIC PANEL
ALT: 43 U/L (ref 0–53)
AST: 27 U/L (ref 0–37)
Albumin: 4.5 g/dL (ref 3.5–5.2)
Alkaline Phosphatase: 77 U/L (ref 39–117)
BUN: 12 mg/dL (ref 6–23)
CO2: 32 mEq/L (ref 19–32)
Calcium: 9.3 mg/dL (ref 8.4–10.5)
Chloride: 98 mEq/L (ref 96–112)
Creatinine, Ser: 1.1 mg/dL (ref 0.40–1.50)
GFR: 86.26 mL/min (ref >60.00)
Glucose, Bld: 98 mg/dL (ref 70–99)
Potassium: 4.3 mEq/L (ref 3.5–5.1)
Sodium: 136 mEq/L (ref 135–145)
Total Bilirubin: 0.2 mg/dL (ref 0.2–1.2)
Total Protein: 7.2 g/dL (ref 6.0–8.3)

## 2022-11-09 LAB — C-REACTIVE PROTEIN: CRP: <1 mg/dL (ref 0.5–20.0)

## 2022-11-09 LAB — SEDIMENTATION RATE: Sed Rate: 4 mm/hr (ref 0–15)

## 2022-11-09 MED ORDER — NA SULFATE-K SULFATE-MG SULF 17.5-3.13-1.6 GM/177ML PO SOLN: 1.0000 | Freq: Once | ORAL | 0 refills | Status: AC

## 2022-11-09 MED ORDER — DICYCLOMINE HCL 20 MG PO TABS: 20.0000 mg | ORAL_TABLET | Freq: Four times a day (QID) | ORAL | 3 refills | Status: AC | PRN

## 2022-11-09 NOTE — Patient Instructions (Signed)
Your provider has requested that you go to the basement level for lab work before leaving today. Press "B" on the elevator. The lab is located at the first door on the left as you exit the elevator.  We have sent the following medications to your pharmacy for you to pick up at your convenience: Dicyclomine 20 mg four times daily as needed prior to meals.  You have been scheduled for a colonoscopy. Please follow written instructions given to you at your visit today.  Please pick up your prep supplies at the pharmacy within the next 1-3 days. If you use inhalers (even only as needed), please bring them with you on the day of your procedure.  _______________________________________________________  If you are age 24 or older, your body mass index should be between 23-30. Your Body mass index is 29.39 kg/m. If this is out of the aforementioned range listed, please consider follow up with your Primary Care Provider.  If you are age 80 or younger, your body mass index should be between 19-25. Your Body mass index is 29.39 kg/m. If this is out of the aformentioned range listed, please consider follow up with your Primary Care Provider.   ________________________________________________________  The [facility] providers would like to encourage you to use [facility] to communicate with providers for non-urgent requests or questions.  Due to long hold times on the telephone, sending your provider a message by [facility] may be a faster and more efficient way to get a response.  Please allow 48 business hours for a response.  Please remember that this is for non-urgent requests.  _______________________________________________________

## 2022-11-09 NOTE — Progress Notes (Signed)
Referring Provider: No ref. provider found Primary Care Physician:  Pcp, No  Reason for Consultation:  Abdominal pain and cramping   IMPRESSION:  Postprandial diarrhea and lower abdominal cramping  Intermittent blood in the stool Recent hypokelamia Frequent marijuana use  Cocaine use Tobacco habituation   PLAN: - CMP, CBC, CRP, ESR - Fecal calprotectin, O&P - Diathirix H pylori - Colonoscopy with biopsies to look for IBD - Trial of Bentyl 20 mg QID PRN taken prior to meals - Cross-sectional imaging if not improving and the evaluation above is negative - Consider trial off marijuana  HPI: Randy Joyce is a 36 y.o. male self-referred. His father, who is also my patient, accompanies him to this appointment. The history is obtained through the patient and review of his electronic health record.   He works as a Administrator.  He was seen in the ED in June 2023 for syncope associated with substance uses.   Post-prandial diarrhea. He has been having 8-10 bowel movements daily for over 5 years. Intermittent bleeding that occurs weekly to monthly. There is associated malodorous flatus.  Appetite is good. Weight is up. He has actually gained 20 pounds recently.  History says bowel obstruction but he describes this as straining with defecation.   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   Using marijuana to help with the pain. It previously helped. He understands that he has to stop smoking in an effort to get medical marijuana and for his work truck Geophysicist/field seismologist.  Smokes black and tans. He drinks beer but has cut back due to these symptoms.   There is no known family history of colon cancer or polyps. No family history of stomach cancer or other GI malignancy. No family history of inflammatory bowel disease or celiac.   No prior abdominal imaging.  No prior endoscopy.   Labs 05/06/22 include normal CMP and CBC except for K 3.2, calcium 8.5,  urine tox screen positive for cocaine and THC   Past Medical History:  Diagnosis Date   Acute GI bleeding    Alcoholism ([place])    Bowel obstruction ([place])    Depression    Stroke (cerebrum) ([place])     History reviewed. No pertinent surgical history.   Current Outpatient Medications  Medication Sig Dispense Refill   multivitamin-iron-minerals-folic acid (CENTRUM) chewable tablet Chew 1 tablet by mouth daily.     No current facility-administered medications for this visit.    Allergies as of 11/09/2022   (No Known Allergies)    Family History  Problem Relation Age of Onset   Hypertension Mother    Diabetes Mother    Colon polyps Mother    Lung cancer Mother    Hypertension Father    Colon polyps Father    Diabetes Father    Colon cancer Neg Hx    Esophageal cancer Neg Hx    Stomach cancer Neg Hx     Social History   Socioeconomic History   Marital status: Single    Spouse name: Not on file   Number of children: 1   Years of education: Not on file   Highest education level: Not on file  Occupational History   Occupation: truck driver  Tobacco Use   Smoking status: Never   Smokeless tobacco: Not on file  Vaping Use   Vaping Use: Former   Substances: Nicotine  Substance and Sexual Activity   Alcohol use: No  Comment: ocassionally   Drug use: Yes    Types: Marijuana   Sexual activity: Not on file  Other Topics Concern   Not on file  Social History Narrative   Not on file   Social Determinants of Health   Financial Resource Strain: Not on file  Food Insecurity: Not on file  Transportation Needs: Not on file  Physical Activity: Not on file  Stress: Not on file  Social Connections: Not on file  Intimate Partner Violence: Not on file    Review of Systems: 12 system ROS is negative except as noted above with the addition of depression, fatigue, fever, headaches, itching, muscle pains, night sweats, insomnia, excessive thirst, urine leakage.    Physical Exam: General:   Alert,  well-nourished, pleasant and cooperative in NAD Head:  Normocephalic and atraumatic. Eyes:  Sclera clear, no icterus.   Conjunctiva pink. Ears:  Normal auditory acuity. Nose:  No deformity, discharge,  or lesions. Mouth:  No deformity or lesions.   Neck:  Supple; no masses or thyromegaly. Lungs:  Clear throughout to auscultation.   No wheezes. Heart:  Regular rate and rhythm; no murmurs. Abdomen:  Soft, nontender, nondistended, normal bowel sounds, no rebound or guarding. No hepatosplenomegaly.   Rectal:  Deferred  Msk:  Symmetrical. No boney deformities LAD: No inguinal or umbilical LAD Extremities:  No clubbing or edema. Neurologic:  Alert and  oriented x4;  grossly nonfocal Skin:  Intact without significant lesions or rashes. Psych:  Alert and cooperative. Normal mood and affect.    Josilynn Losh L. Tarri Glenn, MD, MPH 11/09/2022, 3:36 PM

## 2022-11-12 ENCOUNTER — Encounter: Payer: Self-pay | Admitting: Gastroenterology

## 2022-11-17 ENCOUNTER — Other Ambulatory Visit: Payer: Commercial Managed Care - HMO

## 2022-11-17 DIAGNOSIS — R109 Unspecified abdominal pain: Secondary | ICD-10-CM

## 2022-11-17 DIAGNOSIS — R197 Diarrhea, unspecified: Secondary | ICD-10-CM

## 2022-11-18 LAB — OVA AND PARASITE EXAMINATION
CONCENTRATE RESULT:: NONE SEEN
MICRO NUMBER:: 14388205
SPECIMEN QUALITY:: ADEQUATE
TRICHROME RESULT:: NONE SEEN

## 2022-11-19 LAB — H. PYLORI ANTIGEN, STOOL: H pylori Ag, Stl: NEGATIVE

## 2022-11-21 LAB — CALPROTECTIN, FECAL: Calprotectin, Fecal: 17 ug/g (ref 0–120)

## 2022-12-02 ENCOUNTER — Telehealth: Payer: Self-pay

## 2022-12-02 NOTE — Telephone Encounter (Signed)
Patient returned call & we discussed recent lab results and recommendations. Patient plans to proceed with procedure as scheduled.

## 2022-12-20 ENCOUNTER — Telehealth: Payer: Self-pay | Admitting: Gastroenterology

## 2022-12-20 NOTE — Telephone Encounter (Signed)
Miralax instructions printed for pt. Pt picked up instructions and had no concerns.

## 2022-12-20 NOTE — Telephone Encounter (Signed)
Please call [phone] when prep has been called in

## 2022-12-23 ENCOUNTER — Encounter: Payer: Self-pay | Admitting: Gastroenterology

## 2022-12-23 ENCOUNTER — Ambulatory Visit (AMBULATORY_SURGERY_CENTER): Payer: Commercial Managed Care - HMO | Admitting: Gastroenterology

## 2022-12-23 VITALS — BP 116/75 | HR 72 | Temp 97.8°F | Resp 11 | Ht 69.0 in | Wt 199.0 lb

## 2022-12-23 DIAGNOSIS — R109 Unspecified abdominal pain: Secondary | ICD-10-CM

## 2022-12-23 DIAGNOSIS — D122 Benign neoplasm of ascending colon: Secondary | ICD-10-CM | POA: Diagnosis present

## 2022-12-23 DIAGNOSIS — R197 Diarrhea, unspecified: Secondary | ICD-10-CM | POA: Diagnosis not present

## 2022-12-23 HISTORY — PX: COLONOSCOPY: SHX174

## 2022-12-23 MED ORDER — SODIUM CHLORIDE 0.9 % IV SOLN: 500.0000 mL | Freq: Once | INTRAVENOUS | Status: DC

## 2022-12-23 NOTE — Op Note (Signed)
[facility] Patient Name: Randy Joyce Procedure Date: 12/23/2022 2:00 PM MRN: [id number] Endoscopist: Thornton Park MD, MD, [id number] Age: 37 Referring MD:  Date of Birth: 1985/12/02 Gender: Male Account #: [id number] Procedure:                Colonoscopy Indications:              Abdominal pain, Clinically significant diarrhea of                            unexplained origin Medicines:                Monitored Anesthesia Care Procedure:                Pre-Anesthesia Assessment:                           - Prior to the procedure, a History and Physical                            was performed, and patient medications and                            allergies were reviewed. The patient's tolerance of                            previous anesthesia was also reviewed. The risks                            and benefits of the procedure and the sedation                            options and risks were discussed with the patient.                            All questions were answered, and informed consent                            was obtained. Prior Anticoagulants: The patient has                            taken no anticoagulant or antiplatelet agents. ASA                            Grade Assessment: II - A patient with mild systemic                            disease. After reviewing the risks and benefits,                            the patient was deemed in satisfactory condition to                            undergo the procedure.  After obtaining informed consent, the colonoscope                            was passed under direct vision. Throughout the                            procedure, the patient's blood pressure, pulse, and                            oxygen saturations were monitored continuously. The                            Olympus CF-HQ190L [id number]) Colonoscope was                            introduced through the anus and advanced to  the 5                            cm into the ileum. A second forward view of the                            right colon was performed. The colonoscopy was                            performed without difficulty. The patient tolerated                            the procedure well. The quality of the bowel                            preparation was excellent. The terminal ileum,                            ileocecal valve, appendiceal orifice, and rectum                            were photographed. Scope In: 2:18:01 PM Scope Out: 2:31:42 PM Scope Withdrawal Time: 0 hours 11 minutes 53 seconds  Total Procedure Duration: 0 hours 13 minutes 41 seconds  Findings:                 The perianal and digital rectal examinations were                            normal.                           The entire examined colonic mucosa appeared normal.                            Biopsies for histology were taken with a cold                            forceps from the right colon, left colon and  transverse colon for histology.                           A 2-3 mm polyp was found in the ascending colon.                            The polyp was flat. The polyp was removed with a                            cold snare. Resection and retrieval were complete.                            Estimated blood loss was minimal.                           The terminal ileum appeared normal. Biopsies were                            taken with a cold forceps for histology. Estimated                            blood loss was minimal.                           The exam was otherwise without abnormality on                            direct and retroflexion views. Complications:            No immediate complications. Estimated Blood Loss:     Estimated blood loss was minimal. Impression:               - The entire examined colon is normal. Biopsied.                           - One 2-3 mm polyp in the  ascending colon, removed                            with a cold snare. Resected and retrieved.                           - The examined portion of the ileum was normal.                            Biopsied.                           - The examination was otherwise normal on direct                            and retroflexion views. Recommendation:           - Patient has a contact number available for  emergencies. The signs and symptoms of potential                            delayed complications were discussed with the                            patient. Return to normal activities tomorrow.                            Written discharge instructions were provided to the                            patient.                           - Resume previous diet.                           - Continue present medications including                            dicyclomine.                           - Await pathology results.                           - Repeat colonoscopy date to be determined after                            pending pathology results are reviewed for                            surveillance.                           - Emerging evidence supports eating a diet of                            fruits, vegetables, grains, calcium, and yogurt                            while reducing red meat and alcohol may reduce the                            risk of colon cancer. Thornton Park MD, MD 12/23/2022 2:39:39 PM This report has been signed electronically.

## 2022-12-23 NOTE — Progress Notes (Signed)
Updated medical record.

## 2022-12-23 NOTE — Progress Notes (Signed)
Vss nad trans to pacu °

## 2022-12-23 NOTE — Progress Notes (Signed)
Called to room to assist during endoscopic procedure.  Patient ID and intended procedure confirmed with present staff. Received instructions for my participation in the procedure from the performing physician.  

## 2022-12-23 NOTE — Patient Instructions (Signed)
Handout on polyps given to patient. Await pathology results. Resume previous diet and continue present medications. Repeat colonoscopy for surveillance will be determined based off of pathology results   YOU HAD AN ENDOSCOPIC PROCEDURE TODAY AT [facility]:   Refer to the procedure report that was given to you for any specific questions about what was found during the examination.  If the procedure report does not answer your questions, please call your gastroenterologist to clarify.  If you requested that your care partner not be given the details of your procedure findings, then the procedure report has been included in a sealed envelope for you to review at your convenience later.  YOU SHOULD EXPECT: Some feelings of bloating in the abdomen. Passage of more gas than usual.  Walking can help get rid of the air that was put into your GI tract during the procedure and reduce the bloating. If you had a lower endoscopy (such as a colonoscopy or flexible sigmoidoscopy) you may notice spotting of blood in your stool or on the toilet paper. If you underwent a bowel prep for your procedure, you may not have a normal bowel movement for a few days.  Please Note:  You might notice some irritation and congestion in your nose or some drainage.  This is from the oxygen used during your procedure.  There is no need for concern and it should clear up in a day or so.  SYMPTOMS TO REPORT IMMEDIATELY:  Following lower endoscopy (colonoscopy or flexible sigmoidoscopy):  Excessive amounts of blood in the stool  Significant tenderness or worsening of abdominal pains  Swelling of the abdomen that is new, acute  Fever of 100F or higher  For urgent or emergent issues, a gastroenterologist can be reached at any hour by calling [phone]. Do not use MyChart messaging for urgent concerns.    DIET:  We do recommend a small meal at first, but then you may proceed to your regular diet.  Drink  plenty of fluids but you should avoid alcoholic beverages for 24 hours.  ACTIVITY:  You should plan to take it easy for the rest of today and you should NOT DRIVE or use heavy machinery until tomorrow (because of the sedation medicines used during the test).    FOLLOW UP: Our staff will call the number listed on your records the next business day following your procedure.  We will call around 7:15- 8:00 am to check on you and address any questions or concerns that you may have regarding the information given to you following your procedure. If we do not reach you, we will leave a message.     If any biopsies were taken you will be contacted by phone or by letter within the next 1-3 weeks.  Please call us at [phone] if you have not heard about the biopsies in 3 weeks.    SIGNATURES/CONFIDENTIALITY: You and/or your care partner have signed paperwork which will be entered into your electronic medical record.  These signatures attest to the fact that that the information above on your After Visit Summary has been reviewed and is understood.  Full responsibility of the confidentiality of this discharge information lies with you and/or your care-partner.

## 2022-12-23 NOTE — Progress Notes (Deleted)
Pt's states no medical or surgical changes since previsit or office visit. 

## 2022-12-23 NOTE — Progress Notes (Signed)
Referring Provider: Thornton Park, MD Primary Care Physician:  Pcp, No  Indication for Colonoscopy: Diarrhea   IMPRESSION:  Diarrhea and lower abdominal cramping Intermittent blood in the stool Appropriate candidate for monitored anesthesia care  PLAN: Colonoscopy in the [place] today   HPI: Randy Joyce is a 37 y.o. male presents for colonoscopy to evaluate diarrhea, lower abdominal cramping, and intermittent blood in the stool.  Post-prandial diarrhea. He has been having 8-10 bowel movements daily for over 5 years. Intermittent bleeding that occurs weekly to monthly. There is associated malodorous flatus.  Appetite is good. Weight is up. He has actually gained 20 pounds recently.   History says bowel obstruction but he describes this as straining with defecation.    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    Using marijuana to help with the pain. It previously helped. He understands that he has to stop smoking in an effort to get medical marijuana and for his work truck Geophysicist/field seismologist.   Smokes black and tans. He drinks beer but has cut back due to these symptoms.    There is no known family history of colon cancer or polyps. No family history of stomach cancer or other GI malignancy. No family history of inflammatory bowel disease or celiac.    No prior abdominal imaging.   No prior endoscopy.    Labs 05/06/22 include normal CMP and CBC except for K 3.2, calcium 8.5, urine tox screen positive for cocaine and THC   Past Medical History:  Diagnosis Date   Acute GI bleeding    Alcoholism ([place])    Bowel obstruction ([place])    Depression    Stroke (cerebrum) ([place]) 2015   Substance abuse ([place])     Past Surgical History:  Procedure Laterality Date   COLONOSCOPY  12/23/2022   MOUTH SURGERY  2019   dentures    Current Outpatient Medications  Medication Sig Dispense Refill   dicyclomine (BENTYL) 20 MG tablet Take 1 tablet (20 mg  total) by mouth 4 (four) times daily as needed for spasms. 120 tablet 3   multivitamin-iron-minerals-folic acid (CENTRUM) chewable tablet Chew 1 tablet by mouth daily.     Current Facility-Administered Medications  Medication Dose Route Frequency Provider Last Rate Last Admin   0.9 %  sodium chloride infusion  500 mL Intravenous Once Thornton Park, MD        Allergies as of 12/23/2022   (No Known Allergies)    Family History  Problem Relation Age of Onset   Hypertension Mother    Diabetes Mother    Colon polyps Mother    Lung cancer Mother    Hypertension Father    Colon polyps Father    Diabetes Father    Colon cancer Neg Hx    Esophageal cancer Neg Hx    Stomach cancer Neg Hx    Rectal cancer Neg Hx      Physical Exam: General:   Alert,  well-nourished, pleasant and cooperative in NAD Head:  Normocephalic and atraumatic. Eyes:  Sclera clear, no icterus.   Conjunctiva pink. Mouth:  No deformity or lesions.   Neck:  Supple; no masses or thyromegaly. Lungs:  Clear throughout to auscultation.   No wheezes. Heart:  Regular rate and rhythm; no murmurs. Abdomen:  Soft, non-tender, nondistended, normal bowel sounds, no rebound or guarding.  Msk:  Symmetrical. No boney deformities LAD: No inguinal or umbilical LAD Extremities:  No clubbing or edema.  Neurologic:  Alert and  oriented x4;  grossly nonfocal Skin:  No obvious rash or bruise. Psych:  Alert and cooperative. Normal mood and affect.     Studies/Results: No results found.    Hyman Crossan L. Tarri Glenn, MD, MPH 12/23/2022, 1:58 PM

## 2022-12-26 ENCOUNTER — Telehealth: Payer: Self-pay

## 2022-12-26 NOTE — Telephone Encounter (Signed)
Attempted f/u call. No answer, left VM. 

## 2022-12-29 ENCOUNTER — Encounter: Payer: Self-pay | Admitting: Gastroenterology
